# Patient Record
Sex: Female | Born: 1970 | Race: White | Hispanic: No | Marital: Married | Smoking: Never smoker
Health system: Southern US, Community
[De-identification: ages and names within clinical notes are randomized; demographics above are authoritative.]

## PROBLEM LIST (undated history)

## (undated) DIAGNOSIS — E079 Disorder of thyroid, unspecified: Secondary | ICD-10-CM

## (undated) HISTORY — PX: ABLATION: SHX5711

---

## 2018-01-24 ENCOUNTER — Other Ambulatory Visit: Payer: Self-pay

## 2018-01-24 ENCOUNTER — Encounter (HOSPITAL_BASED_OUTPATIENT_CLINIC_OR_DEPARTMENT_OTHER): Payer: Self-pay | Admitting: Emergency Medicine

## 2018-01-24 ENCOUNTER — Emergency Department (HOSPITAL_BASED_OUTPATIENT_CLINIC_OR_DEPARTMENT_OTHER)
Admission: EM | Admit: 2018-01-24 | Discharge: 2018-01-25 | Disposition: A | Discharge status: Home / Self Care | Payer: BLUE CROSS/BLUE SHIELD | Attending: Emergency Medicine | Admitting: Emergency Medicine

## 2018-01-24 ENCOUNTER — Emergency Department (HOSPITAL_BASED_OUTPATIENT_CLINIC_OR_DEPARTMENT_OTHER): Payer: BLUE CROSS/BLUE SHIELD

## 2018-01-24 DIAGNOSIS — R509 Fever, unspecified: Secondary | ICD-10-CM

## 2018-01-24 DIAGNOSIS — Z79899 Other long term (current) drug therapy: Secondary | ICD-10-CM | POA: Insufficient documentation

## 2018-01-24 DIAGNOSIS — J189 Pneumonia, unspecified organism: Secondary | ICD-10-CM | POA: Diagnosis not present

## 2018-01-24 DIAGNOSIS — J181 Lobar pneumonia, unspecified organism: Secondary | ICD-10-CM

## 2018-01-24 HISTORY — DX: Disorder of thyroid, unspecified: E07.9

## 2018-01-24 LAB — COMPREHENSIVE METABOLIC PANEL
ALK PHOS: 42 U/L (ref 38–126)
ALT: 20 U/L (ref 14–54)
AST: 20 U/L (ref 15–41)
Albumin: 3.9 g/dL (ref 3.5–5.0)
Anion gap: 8 (ref 5–15)
BUN: 8 mg/dL (ref 6–20)
CALCIUM: 8.4 mg/dL — AB (ref 8.9–10.3)
CO2: 25 mmol/L (ref 22–32)
CREATININE: 0.78 mg/dL (ref 0.44–1.00)
Chloride: 99 mmol/L — ABNORMAL LOW (ref 101–111)
GFR calc non Af Amer: >60 mL/min (ref >60)
GLUCOSE: 102 mg/dL — AB (ref 65–99)
Potassium: 3.4 mmol/L — ABNORMAL LOW (ref 3.5–5.1)
SODIUM: 132 mmol/L — AB (ref 135–145)
Total Bilirubin: 0.9 mg/dL (ref 0.3–1.2)
Total Protein: 6.9 g/dL (ref 6.5–8.1)

## 2018-01-24 LAB — CBC WITH DIFFERENTIAL/PLATELET
BASOS ABS: 0 10*3/uL (ref 0.0–0.1)
BASOS PCT: 0 %
EOS ABS: 0 10*3/uL (ref 0.0–0.7)
Eosinophils Relative: 0 %
HCT: 38.4 % (ref 36.0–46.0)
Hemoglobin: 13.1 g/dL (ref 12.0–15.0)
Lymphocytes Relative: 14 %
Lymphs Abs: 1 10*3/uL (ref 0.7–4.0)
MCH: 30.3 pg (ref 26.0–34.0)
MCHC: 34.1 g/dL (ref 30.0–36.0)
MCV: 88.7 fL (ref 78.0–100.0)
MONO ABS: 0.3 10*3/uL (ref 0.1–1.0)
MONOS PCT: 5 %
NEUTROS PCT: 81 %
Neutro Abs: 5.6 10*3/uL (ref 1.7–7.7)
Platelets: 190 10*3/uL (ref 150–400)
RBC: 4.33 MIL/uL (ref 3.87–5.11)
RDW: 14.1 % (ref 11.5–15.5)
WBC: 6.9 10*3/uL (ref 4.0–10.5)

## 2018-01-24 MED ORDER — DOXYCYCLINE HYCLATE 100 MG PO TABS
100.0000 mg | ORAL_TABLET | Freq: Once | ORAL | Status: AC
  Administered 2018-01-24: 100 mg via ORAL
  Filled 2018-01-24: qty 1

## 2018-01-24 MED ORDER — SODIUM CHLORIDE 0.9 % IV BOLUS
1000.0000 mL | Freq: Once | INTRAVENOUS | Status: AC
  Administered 2018-01-24: 1000 mL via INTRAVENOUS

## 2018-01-24 MED ORDER — IBUPROFEN 200 MG PO TABS
600.0000 mg | ORAL_TABLET | Freq: Once | ORAL | Status: AC
  Administered 2018-01-24: 600 mg via ORAL
  Filled 2018-01-24: qty 1

## 2018-01-24 MED ORDER — ONDANSETRON 4 MG PO TBDP
4.0000 mg | ORAL_TABLET | Freq: Once | ORAL | Status: AC
  Administered 2018-01-24: 4 mg via ORAL
  Filled 2018-01-24: qty 1

## 2018-01-24 MED ORDER — DOXYCYCLINE HYCLATE 100 MG PO CAPS: 100.0000 mg | ORAL_CAPSULE | Freq: Two times a day (BID) | ORAL | 0 refills | Status: AC

## 2018-01-24 MED ORDER — ACETAMINOPHEN 500 MG PO TABS
1000.0000 mg | ORAL_TABLET | Freq: Once | ORAL | Status: AC
  Administered 2018-01-25: 500 mg via ORAL
  Filled 2018-01-24: qty 2

## 2018-01-24 MED ORDER — ONDANSETRON 4 MG PO TBDP: 4.0000 mg | ORAL_TABLET | Freq: Three times a day (TID) | ORAL | 0 refills | Status: AC | PRN

## 2018-01-24 NOTE — ED Notes (Signed)
Up to b/r, steady gait, alert, NAD, calm, interactive, steady gait, no dyspnea.

## 2018-01-24 NOTE — ED Triage Notes (Signed)
Patient states that she has a fever since Thursday - and having SOB - the patient reports that she has had cough and congestion  - patient also reports that she is having generalized acehs

## 2018-01-24 NOTE — Progress Notes (Signed)
Patient ambulated around department while on pulse ox.  Patient's SPO2 remained between 94% and 97% and her HR remained between 89 and 97.

## 2018-01-24 NOTE — Discharge Instructions (Addendum)
1. Mass-like area of probable airspace consolidation in the region  of the superior segment of the right lower lobe, which likely  reflects an area of pneumonia. Followup PA and lateral chest X-ray  is recommended in 3-4 weeks following trial of antibiotic therapy to  ensure resolution and exclude underlying malignancy.   Please take Ibuprofen (Advil, motrin) and Tylenol (acetaminophen) to relieve your pain.  You may take up to 600 MG (3 pills) of normal strength ibuprofen every 8 hours as needed.  In between doses of ibuprofen you make take tylenol, up to 1,000 mg (two extra strength pills).  Do not take more than 3,000 mg tylenol in a 24 hour period.  Please check all medication labels as many medications such as pain and cold medications may contain tylenol.  Do not drink alcohol while taking these medications.  Do not take other NSAID'S while taking ibuprofen (such as aleve or naproxen).  Please take ibuprofen with food to decrease stomach upset.  You may have diarrhea from the antibiotics.  It is very important that you continue to take the antibiotics even if you get diarrhea unless a medical professional tells you that you may stop taking them.  If you stop too early the bacteria you are being treated for will become stronger and you may need different, more powerful antibiotics that have more side effects and worsening diarrhea.  Please stay well hydrated and consider probiotics as they may decrease the severity of your diarrhea.  Please be aware that if you take any hormonal contraception (birth control pills, nexplanon, the ring, etc) that your birth control will not work while you are taking antibiotics and you need to use back up protection as directed on the birth control medication information insert.

## 2018-01-24 NOTE — ED Triage Notes (Signed)
Patient refusing the tylenol at this time, would prefer motrin

## 2018-01-24 NOTE — ED Provider Notes (Signed)
[facility] EMERGENCY DEPARTMENT Provider Note   CSN: [id number] Arrival date & time: 01/24/18  1925     History   Chief Complaint Chief Complaint  Patient presents with  . Fever    HPI Marissa Stevens is a 47 y.o. female who presents today for evaluation of fever and generally not feeling well for the past few days.  She reports that she has been having hot flashes and cold chills.  He reports that she works at a local [place] and therefore has multiple sick contacts.  She reports that her body aches, she has poor appetite, without abdominal pain, nausea or vomiting.  She denies any urinary symptoms.  No cough or shortness of breath.  HPI  Past Medical History:  Diagnosis Date  . Thyroid disease     There are no active problems to display for this patient.   Past Surgical History:  Procedure Laterality Date  . ABLATION       OB History   None      Home Medications    Prior to Admission medications   Medication Sig Start Date End Date Taking? Authorizing Provider  levothyroxine (SYNTHROID, LEVOTHROID) 125 MCG tablet Take 125 mcg by mouth daily before breakfast.   Yes [provider]  doxycycline (VIBRAMYCIN) 100 MG capsule Take 1 capsule (100 mg total) by mouth 2 (two) times daily for 7 days. 01/24/18 01/31/18  Cristina Gong, PA-C  ondansetron (ZOFRAN ODT) 4 MG disintegrating tablet Take 1 tablet (4 mg total) by mouth every 8 (eight) hours as needed for nausea or vomiting. 01/24/18   Cristina Gong, PA-C    Family History History reviewed. No pertinent family history.  Social History Social History   Tobacco Use  . Smoking status: Never Smoker  . Smokeless tobacco: Never Used  Substance Use Topics  . Alcohol use: Never    Frequency: Never  . Drug use: Never     Allergies   Patient has no known allergies.   Review of Systems Review of Systems  Constitutional: Positive for activity change, appetite change, chills, fatigue and  fever. Negative for unexpected weight change.  HENT: Positive for congestion. Negative for ear pain and sore throat.   Eyes: Negative for pain and visual disturbance.  Respiratory: Positive for cough and shortness of breath.   Cardiovascular: Negative for chest pain and palpitations.  Gastrointestinal: Negative for abdominal pain and vomiting.  Genitourinary: Negative for dysuria and hematuria.  Musculoskeletal: Negative for arthralgias, back pain, neck pain and neck stiffness.  Skin: Negative for color change and rash.  Neurological: Positive for headaches. Negative for seizures and syncope.  Psychiatric/Behavioral: Negative for confusion.  All other systems reviewed and are negative.    Physical Exam Updated Vital Signs BP 107/63   Pulse 81   Temp (!) 101.3 F (38.5 C) (Oral)   Resp 20   Ht 5\' 7"  (1.702 m)   Wt 60.8 kg (134 lb)   SpO2 96%   BMI 20.99 kg/m   Physical Exam  Constitutional: She appears well-developed and well-nourished. No distress.  HENT:  Head: Normocephalic and atraumatic.  Right Ear: Tympanic membrane, external ear and ear canal normal.  Left Ear: Tympanic membrane, external ear and ear canal normal.  Nose: Nose normal.  Mouth/Throat: Uvula is midline, oropharynx is clear and moist and mucous membranes are normal. No tonsillar exudate.  Eyes: Conjunctivae are normal.  Neck: Normal range of motion and full passive range of motion without pain. Neck  supple.  Cardiovascular: Normal rate, regular rhythm and normal heart sounds.  No murmur heard. Pulmonary/Chest: Effort normal. No accessory muscle usage. No respiratory distress. She has rhonchi in the right middle field.  Abdominal: Soft. Normal appearance and bowel sounds are normal. She exhibits no distension. There is no tenderness.  Musculoskeletal: She exhibits no edema.  Lymphadenopathy:    She has no cervical adenopathy.  Neurological: She is alert.  Skin: Skin is warm and dry.  Psychiatric: She  has a normal mood and affect.  Nursing note and vitals reviewed.    ED Treatments / Results  Labs (all labs ordered are listed, but only abnormal results are displayed) Labs Reviewed  COMPREHENSIVE METABOLIC PANEL - Abnormal; Notable for the following components:      Result Value   Sodium 132 (*)    Potassium 3.4 (*)    Chloride 99 (*)    Glucose, Bld 102 (*)    Calcium 8.4 (*)    All other components within normal limits  CBC WITH DIFFERENTIAL/PLATELET    EKG None  Radiology Dg Chest 2 View  Result Date: 01/24/2018 CLINICAL DATA:  47 year old female with history of fever and cough for the past 4 days. Nausea and body aches for the past 2 days. EXAM: CHEST - 2 VIEW COMPARISON:  None. FINDINGS: Mass-like area of airspace consolidation which projects in the region in the superior segment of the right lower lobe. Left lung is clear. No evidence of pulmonary edema. Heart size is normal. Upper mediastinal contours are within normal limits. IMPRESSION: 1. Mass-like area of probable airspace consolidation in the region of the superior segment of the right lower lobe, which likely reflects an area of pneumonia. Followup PA and lateral chest X-ray is recommended in 3-4 weeks following trial of antibiotic therapy to ensure resolution and exclude underlying malignancy. Electronically Signed   By: Trudie Reedaniel  Entrikin M.D.   On: 01/24/2018 20:43    Procedures Procedures (including critical care time)  Medications Ordered in ED Medications  doxycycline (VIBRA-TABS) tablet 100 mg (100 mg Oral Given 01/24/18 2213)  ondansetron (ZOFRAN-ODT) disintegrating tablet 4 mg (4 mg Oral Given 01/24/18 2213)  ibuprofen (ADVIL,MOTRIN) tablet 600 mg (600 mg Oral Given 01/24/18 2213)  sodium chloride 0.9 % bolus 1,000 mL (0 mLs Intravenous Stopped 01/24/18 2333)  acetaminophen (TYLENOL) tablet 1,000 mg (500 mg Oral Given 01/25/18 0010)     Initial Impression / Assessment and Plan / ED Course  I have reviewed the  triage vital signs and the nursing notes.  Pertinent labs & imaging results that were available during my care of the patient were reviewed by me and considered in my medical decision making (see chart for details).    Patient presents today for evaluation of fever, and generally not feeling well this is been going on for the past few days with associated SOB and congestion.  CXR obtained showing a right sided pneumonia.  Her symptoms and history are consistent with pneumonia.  She was treated in the ED with doxycycline.  Labs were obtained and reviewed, no leukocytosis.  She has a mild decrease in sodium, potassium and cl, most likely secondary to poor PO intake.  She was febrile in the department which was treated with tylenol and ibuprofen.  She is not hypoxic, able to ambulate with out desaturation.  PORT score 38, patient is generally healthy and appears safe for outpatient antibiotics.  Strict return precautions were discussed and patient states her understanding.  Patient does not  have EKG on file, there fore will be treated with doxy over azithromycin.  Given first dose here in the ED.  Patient discharged home. Informed of need for repeat x-ray.   Final Clinical Impressions(s) / ED Diagnoses   Final diagnoses:  Community acquired pneumonia of right middle lobe of lung (HCC)  Fever, unspecified fever cause    ED Discharge Orders        Ordered    doxycycline (VIBRAMYCIN) 100 MG capsule  2 times daily     01/24/18 2353    ondansetron (ZOFRAN ODT) 4 MG disintegrating tablet  Every 8 hours PRN     01/24/18 2353       Cristina Gong, PA-C 01/25/18 Precious Gilding, MD 02/01/18 [phone]

## 2018-01-24 NOTE — ED Notes (Signed)
Alert, NAD, calm, interactive, resps e/u, speaking in clear complete sentences, no dyspnea noted, skin W&D, VSS, mentions HA, fever and cough (denies: other pain, sob, nausea, dizziness or visual changes). [facility]

## 2018-12-12 IMAGING — CR DG CHEST 2V
2 series · 2 of 2 positions shown · non-contrast
Comparison: None.

CLINICAL DATA: 46-year-old female with history of fever and cough
for the past 4 days. Nausea and body aches for the past 2 days.

EXAM:
CHEST - 2 VIEW

[w chest pa]
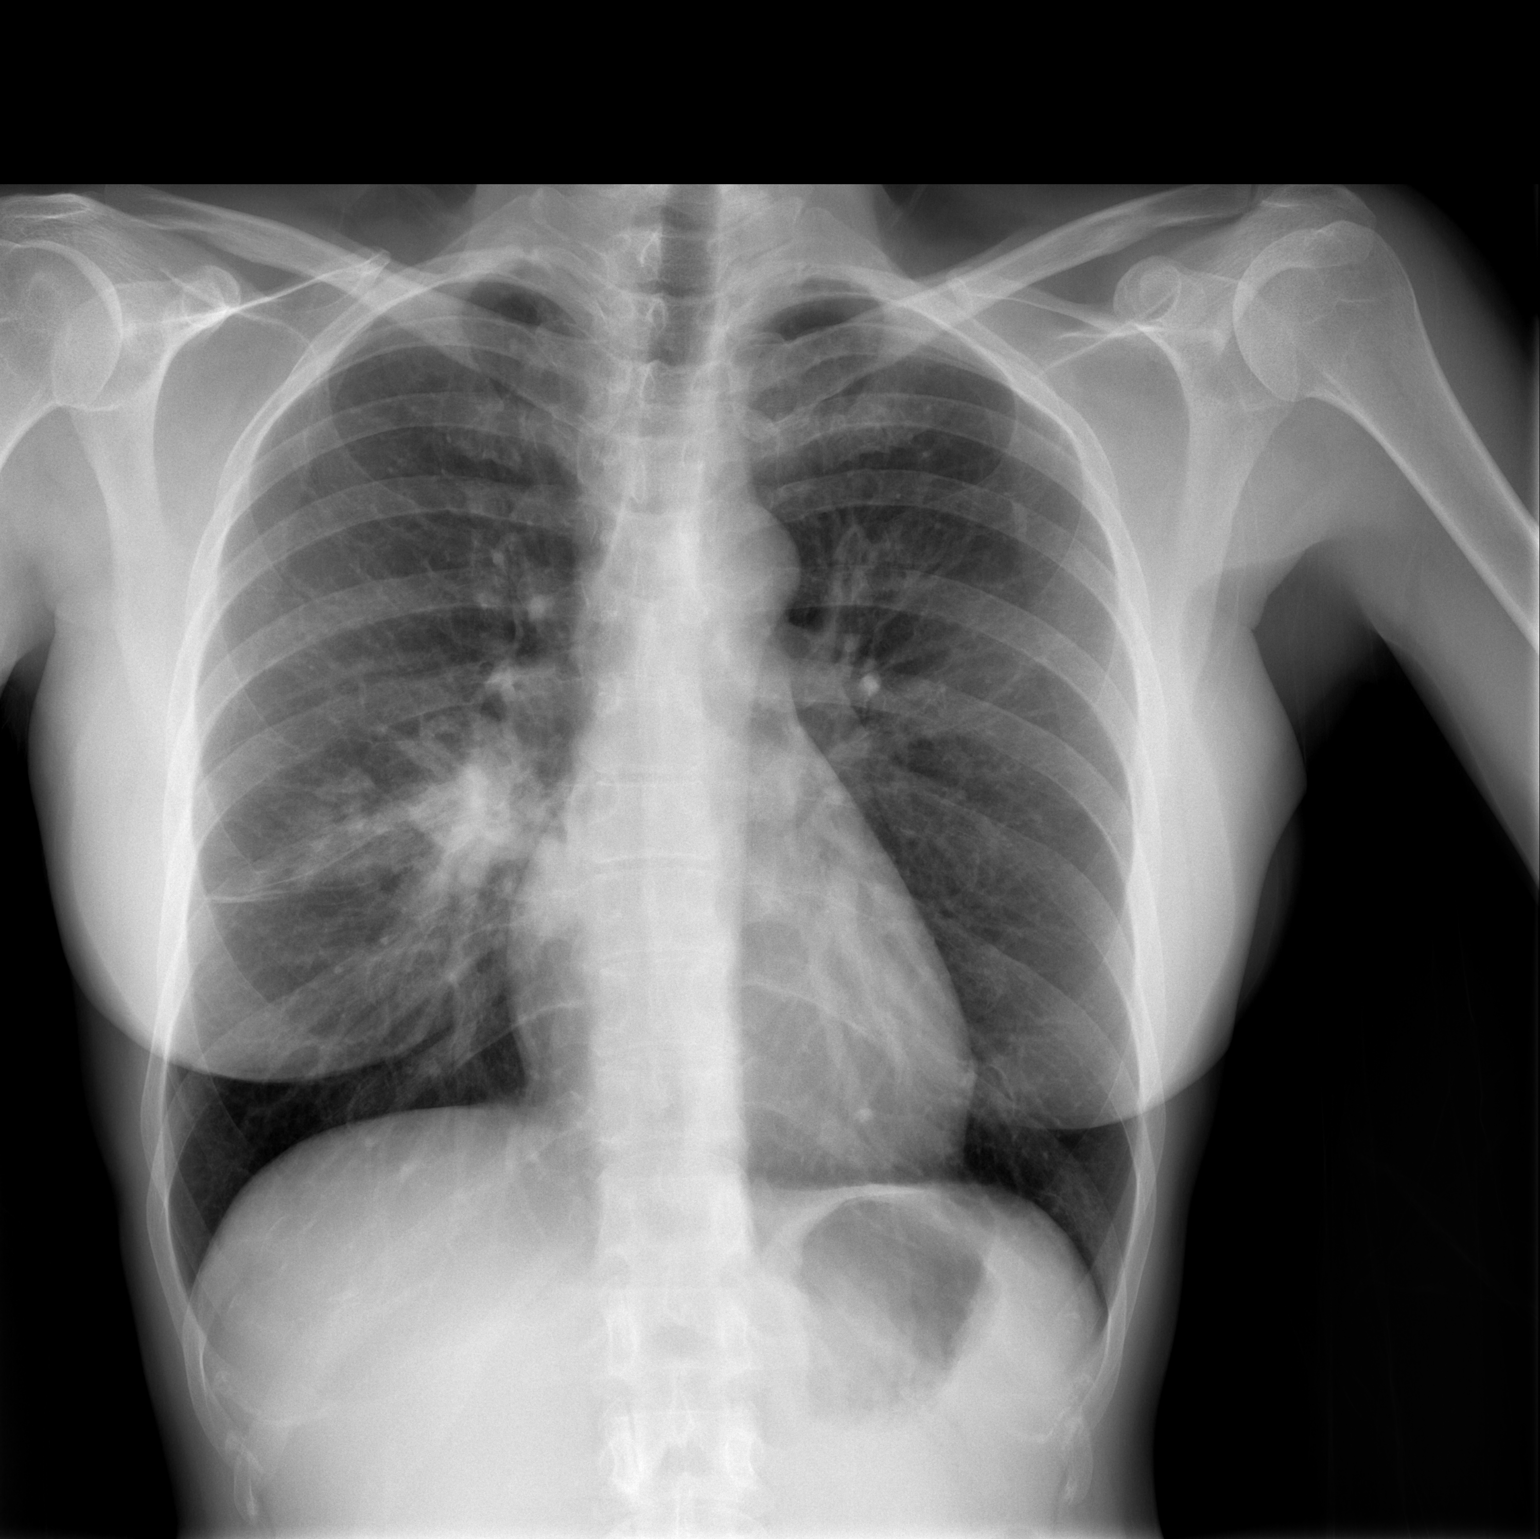

[w chest lat]
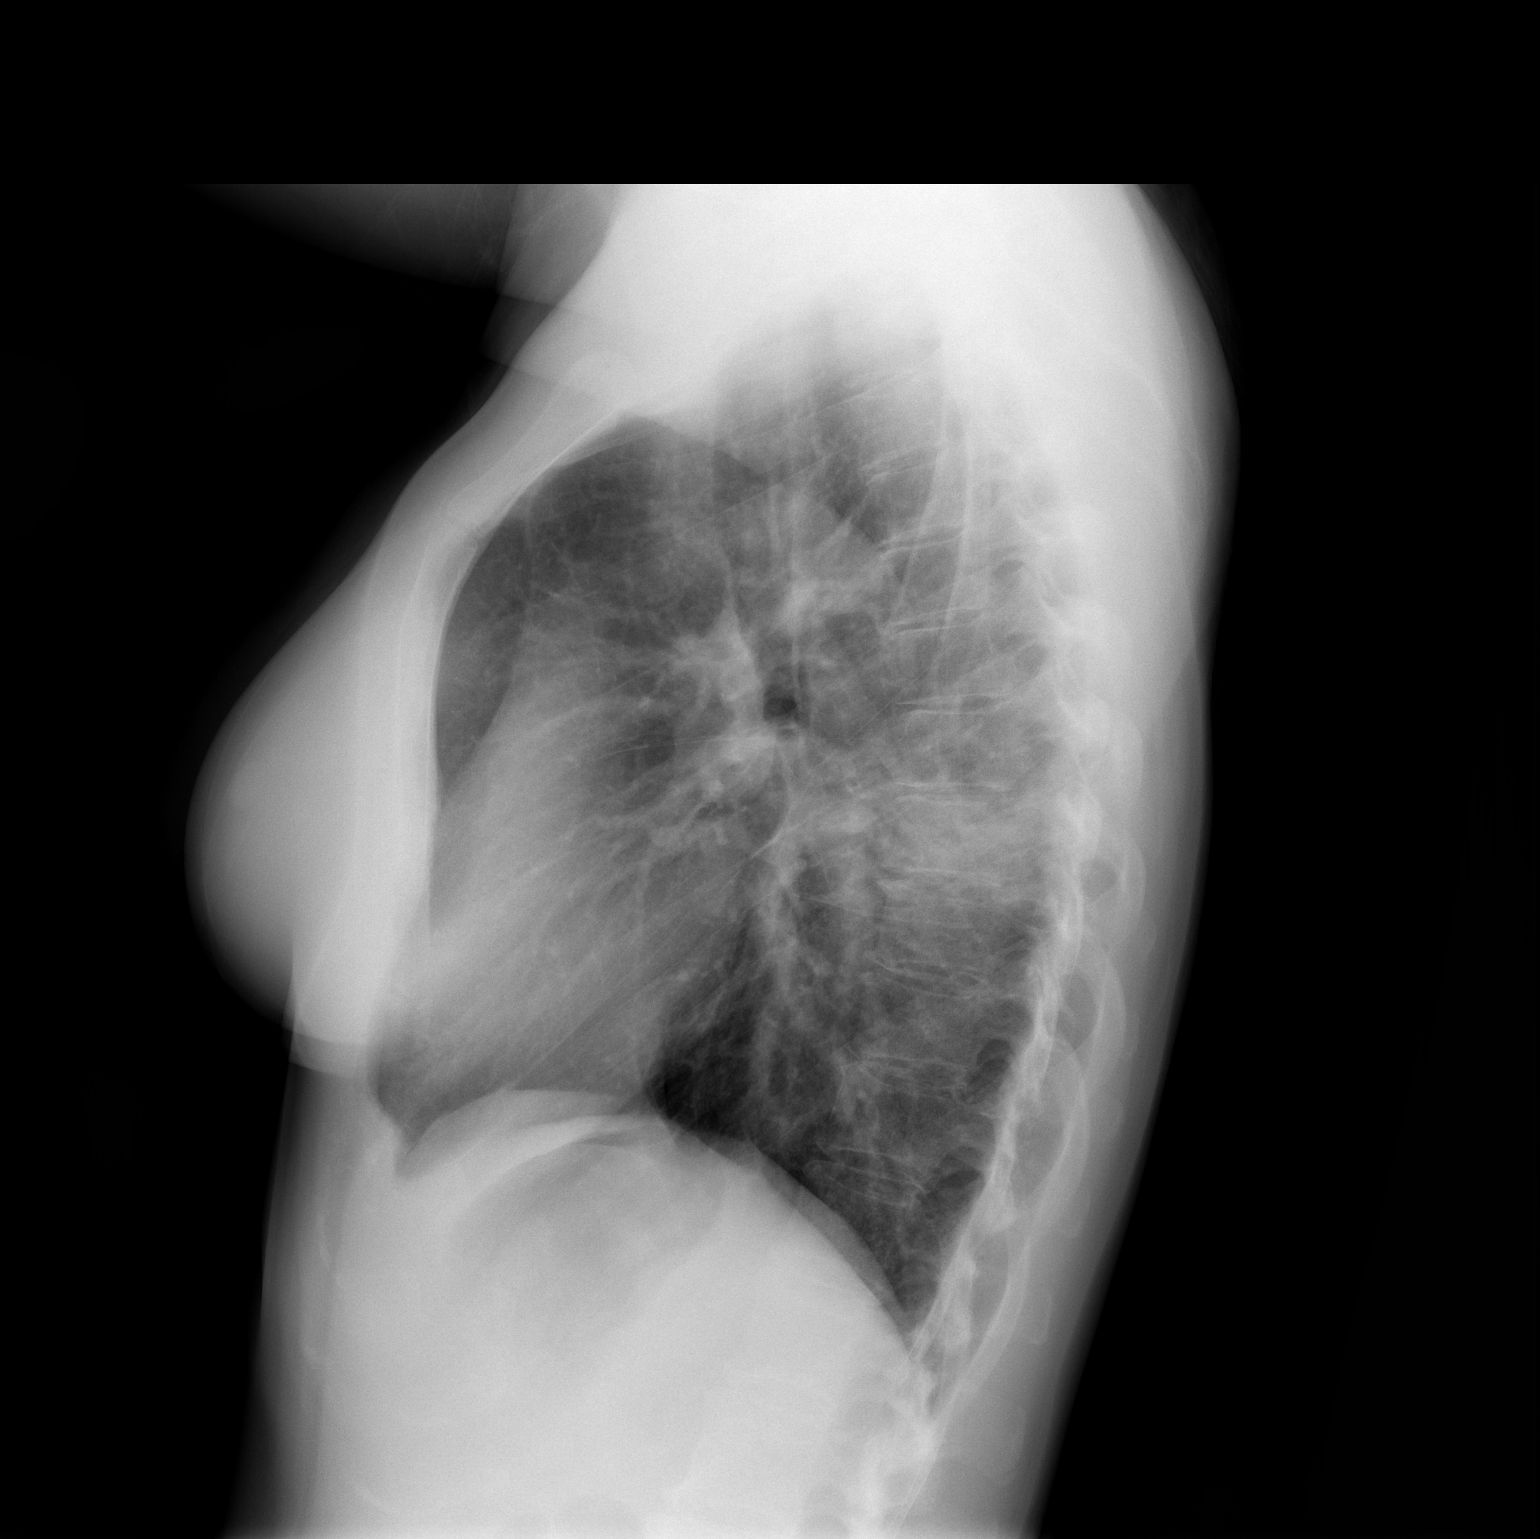

[2 of 2 positions shown; findings below may reference images not displayed]

FINDINGS: Mass-like area of airspace consolidation which projects in the
region in the superior segment of the right lower lobe. Left lung is
clear. No evidence of pulmonary edema. Heart size is normal. Upper
mediastinal contours are within normal limits.
IMPRESSION: 1. Mass-like area of probable airspace consolidation in the region
of the superior segment of the right lower lobe, which likely
reflects an area of pneumonia. Followup PA and lateral chest X-ray
is recommended in 3-4 weeks following trial of antibiotic therapy to
ensure resolution and exclude underlying malignancy.
